# Patient Record
Sex: Male | Born: 2000 | Hispanic: No | Marital: Single | Smoking: Never smoker
Health system: Southern US, Community
[De-identification: ages and names within clinical notes are randomized; demographics above are authoritative.]

---
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
## 2018-02-06 ENCOUNTER — Encounter (HOSPITAL_BASED_OUTPATIENT_CLINIC_OR_DEPARTMENT_OTHER): Payer: Self-pay | Admitting: Emergency Medicine

## 2018-02-06 ENCOUNTER — Emergency Department (HOSPITAL_BASED_OUTPATIENT_CLINIC_OR_DEPARTMENT_OTHER)
Admission: EM | Admit: 2018-02-06 | Discharge: 2018-02-06 | Disposition: A | Discharge status: Home / Self Care | Payer: 59 | Attending: Emergency Medicine | Admitting: Emergency Medicine

## 2018-02-06 ENCOUNTER — Other Ambulatory Visit: Payer: Self-pay

## 2018-02-06 DIAGNOSIS — K1379 Other lesions of oral mucosa: Secondary | ICD-10-CM | POA: Diagnosis not present

## 2018-02-06 DIAGNOSIS — R070 Pain in throat: Secondary | ICD-10-CM | POA: Diagnosis present

## 2018-02-06 DIAGNOSIS — J029 Acute pharyngitis, unspecified: Secondary | ICD-10-CM | POA: Insufficient documentation

## 2018-02-06 LAB — GROUP A STREP BY PCR: Group A Strep by PCR: NOT DETECTED

## 2018-02-06 MED ORDER — IBUPROFEN 200 MG PO TABS
600.0000 mg | ORAL_TABLET | Freq: Once | ORAL | Status: AC
  Administered 2018-02-06: 600 mg via ORAL
  Filled 2018-02-06: qty 1

## 2018-02-06 NOTE — Discharge Instructions (Signed)
You have some swelling of the uvula  Take motrin for pain and swelling   Stay hydrated   See your doctor  Return to ER if you have worse sore throat, fever, trouble swallowing

## 2018-02-06 NOTE — ED Provider Notes (Signed)
[facility] EMERGENCY DEPARTMENT Provider Note   CSN: [id number] Arrival date & time: 02/06/18  1039     History   Chief Complaint Chief Complaint  Patient presents with  . Sore Throat    HPI Martin Rogers is a 18 y.o. male here presenting with sore throat.  Patient states that he has been having sore throat for the last 3 days.  Patient also states that since yesterday he noticed some more swelling in the back of his throat.  Denies any trouble swallowing.  He thought his tonsils were enlarged and it dropped down but he was reassured referring to his uvula.  Denies any fevers or cough.  The history is provided by the patient.    History reviewed. No pertinent past medical history.  There are no active problems to display for this patient.   History reviewed. No pertinent surgical history.      Home Medications    Prior to Admission medications   Not on File    Family History History reviewed. No pertinent family history.  Social History Social History   Tobacco Use  . Smoking status: Never Smoker  . Smokeless tobacco: Never Used  Substance Use Topics  . Alcohol use: No    Alcohol/week: 0.0 standard drinks  . Drug use: No     Allergies   Patient has no known allergies.   Review of Systems Review of Systems  HENT: Positive for sore throat.   All other systems reviewed and are negative.    Physical Exam Updated Vital Signs BP 133/75 (BP Location: Left Arm)   Pulse 77   Temp 98.1 F (36.7 C) (Oral)   Resp 18   Ht 5\' 5"  (1.651 m)   Wt 54.4 kg   SpO2 100%   BMI 19.97 kg/m   Physical Exam Vitals signs and nursing note reviewed.  Constitutional:      Appearance: He is well-developed.  HENT:     Head: Normocephalic.     Right Ear: Tympanic membrane normal.     Left Ear: Tympanic membrane normal.     Mouth/Throat:     Tonsils: No tonsillar exudate or tonsillar abscesses.     Comments: Posterior pharynx slightly red, uvula  enlarged but is midline. Tonsils not enlarged  Eyes:     Conjunctiva/sclera: Conjunctivae normal.  Neck:     Musculoskeletal: Normal range of motion.  Cardiovascular:     Rate and Rhythm: Normal rate and regular rhythm.  Pulmonary:     Effort: Pulmonary effort is normal.  Abdominal:     Palpations: Abdomen is soft.  Skin:    General: Skin is warm.     Capillary Refill: Capillary refill takes less than 2 seconds.  Neurological:     General: No focal deficit present.     Mental Status: He is alert and oriented to person, place, and time.  Psychiatric:        Mood and Affect: Mood normal.        Behavior: Behavior normal.      ED Treatments / Results  Labs (all labs ordered are listed, but only abnormal results are displayed) Labs Reviewed  GROUP A STREP BY PCR    EKG None  Radiology No results found.  Procedures Procedures (including critical care time)  Medications Ordered in ED Medications  ibuprofen (ADVIL,MOTRIN) tablet 600 mg (has no administration in time range)     Initial Impression / Assessment and Plan / ED Course  I  have reviewed the triage vital signs and the nursing notes.  Pertinent labs & imaging results that were available during my care of the patient were reviewed by me and considered in my medical decision making (see chart for details).    Martin Rogers is a 18 y.o. male here with sore throat. Afebrile, well appearing. He thought his tonsils are enlarged but it was just mild uvula enlargement. No cervical LAD. Strep negative. Likely viral pharyngitis, recommend motrin prn.    Final Clinical Impressions(s) / ED Diagnoses   Final diagnoses:  None    ED Discharge Orders    None       Charlynne Pander, MD 02/06/18 1145

## 2018-02-06 NOTE — ED Triage Notes (Signed)
Reports sore throat x 3 days.

## 2019-02-22 DIAGNOSIS — Z23 Encounter for immunization: Secondary | ICD-10-CM | POA: Diagnosis not present

## 2019-04-19 ENCOUNTER — Ambulatory Visit: Payer: Self-pay | Attending: Family

## 2019-04-19 DIAGNOSIS — Z23 Encounter for immunization: Secondary | ICD-10-CM

## 2019-04-19 NOTE — Progress Notes (Signed)
   Covid-19 Vaccination Clinic  Name:  Martin Rogers    MRN: [id number] DOB: 2000/08/10  04/19/2019  [name] was observed post Covid-19 immunization for 15 minutes without incident. He was provided with Vaccine Information Sheet and instruction to access the V-Safe system.   [name] was instructed to call 911 with any severe reactions post vaccine: Marland Kitchen Difficulty breathing  . Swelling of face and throat  . A fast heartbeat  . A bad rash all over body  . Dizziness and weakness   Immunizations Administered    Name Date Dose VIS Date Route   Moderna COVID-19 Vaccine 04/19/2019  1:06 PM 0.5 mL 12/26/2018 Intramuscular   Manufacturer: Moderna   Lot: [id number]   NDC: 00174-944-96

## 2019-05-22 ENCOUNTER — Ambulatory Visit: Payer: Self-pay | Attending: Family

## 2019-05-22 ENCOUNTER — Ambulatory Visit: Payer: Self-pay

## 2019-05-22 DIAGNOSIS — Z23 Encounter for immunization: Secondary | ICD-10-CM

## 2019-05-22 NOTE — Progress Notes (Signed)
   Covid-19 Vaccination Clinic  Name:  Martin Rogers    MRN: [id number] DOB: 2000/03/04  05/22/2019  Mr. Barna was observed post Covid-19 immunization for 15 minutes without incident. He was provided with Vaccine Information Sheet and instruction to access the V-Safe system.   Mr. Minner was instructed to call 911 with any severe reactions post vaccine: Marland Kitchen Difficulty breathing  . Swelling of face and throat  . A fast heartbeat  . A bad rash all over body  . Dizziness and weakness   Immunizations Administered    Name Date Dose VIS Date Route   Moderna COVID-19 Vaccine 05/22/2019 10:23 AM 0.5 mL 12/2018 Intramuscular   Manufacturer: Moderna   Lot: [id number]   NDC: 56812-751-70

## 2019-08-17 DIAGNOSIS — Z20822 Contact with and (suspected) exposure to covid-19: Secondary | ICD-10-CM | POA: Diagnosis not present

## 2019-08-21 DIAGNOSIS — Z20822 Contact with and (suspected) exposure to covid-19: Secondary | ICD-10-CM | POA: Diagnosis not present
